# Patient Record
Sex: Female | Born: 1955 | Race: White | Hispanic: No | Marital: Single | Smoking: Current every day smoker
Health system: Southern US, Community
[De-identification: ages and names within clinical notes are randomized; demographics above are authoritative.]

## PROBLEM LIST (undated history)

## (undated) DIAGNOSIS — J449 Chronic obstructive pulmonary disease, unspecified: Secondary | ICD-10-CM

## (undated) DIAGNOSIS — J45909 Unspecified asthma, uncomplicated: Secondary | ICD-10-CM

## (undated) HISTORY — PX: ESOPHAGEAL DILATION: SHX303

## (undated) HISTORY — PX: BRONCHOSCOPY: SUR163

---

## 2007-11-22 ENCOUNTER — Emergency Department: Payer: Self-pay | Admitting: Internal Medicine

## 2018-07-22 ENCOUNTER — Other Ambulatory Visit: Payer: Self-pay | Admitting: Physician Assistant

## 2018-07-22 DIAGNOSIS — Z1231 Encounter for screening mammogram for malignant neoplasm of breast: Secondary | ICD-10-CM

## 2018-08-07 ENCOUNTER — Ambulatory Visit
Admission: RE | Admit: 2018-08-07 | Discharge: 2018-08-07 | Disposition: A | Discharge status: Home / Self Care | Payer: Medicaid Other | Source: Ambulatory Visit | Attending: Physician Assistant | Admitting: Physician Assistant

## 2018-08-07 DIAGNOSIS — Z1231 Encounter for screening mammogram for malignant neoplasm of breast: Secondary | ICD-10-CM | POA: Diagnosis not present

## 2018-08-12 ENCOUNTER — Other Ambulatory Visit: Payer: Self-pay | Admitting: *Deleted

## 2018-08-12 ENCOUNTER — Inpatient Hospital Stay
Admission: RE | Admit: 2018-08-12 | Discharge: 2018-08-12 | Disposition: A | Discharge status: Home / Self Care | Payer: Self-pay | Source: Ambulatory Visit | Attending: *Deleted | Admitting: *Deleted

## 2018-08-12 DIAGNOSIS — Z9289 Personal history of other medical treatment: Secondary | ICD-10-CM

## 2018-08-14 ENCOUNTER — Other Ambulatory Visit: Payer: Self-pay | Admitting: Physician Assistant

## 2018-08-14 DIAGNOSIS — N6320 Unspecified lump in the left breast, unspecified quadrant: Secondary | ICD-10-CM

## 2018-08-14 DIAGNOSIS — R928 Other abnormal and inconclusive findings on diagnostic imaging of breast: Secondary | ICD-10-CM

## 2018-08-21 ENCOUNTER — Ambulatory Visit
Admission: RE | Admit: 2018-08-21 | Discharge: 2018-08-21 | Disposition: A | Discharge status: Home / Self Care | Payer: Medicaid Other | Source: Ambulatory Visit | Attending: Physician Assistant | Admitting: Physician Assistant

## 2018-08-21 DIAGNOSIS — R928 Other abnormal and inconclusive findings on diagnostic imaging of breast: Secondary | ICD-10-CM

## 2018-08-21 DIAGNOSIS — N6320 Unspecified lump in the left breast, unspecified quadrant: Secondary | ICD-10-CM | POA: Diagnosis present

## 2018-08-21 DIAGNOSIS — N6321 Unspecified lump in the left breast, upper outer quadrant: Secondary | ICD-10-CM | POA: Insufficient documentation

## 2018-09-04 ENCOUNTER — Emergency Department
Admission: EM | Admit: 2018-09-04 | Discharge: 2018-09-04 | Disposition: A | Discharge status: Home / Self Care | Payer: Medicaid Other | Attending: Emergency Medicine | Admitting: Emergency Medicine

## 2018-09-04 ENCOUNTER — Emergency Department: Payer: Medicaid Other

## 2018-09-04 ENCOUNTER — Encounter: Payer: Self-pay | Admitting: Emergency Medicine

## 2018-09-04 ENCOUNTER — Other Ambulatory Visit: Payer: Self-pay

## 2018-09-04 DIAGNOSIS — M25512 Pain in left shoulder: Secondary | ICD-10-CM | POA: Insufficient documentation

## 2018-09-04 DIAGNOSIS — Z7982 Long term (current) use of aspirin: Secondary | ICD-10-CM | POA: Insufficient documentation

## 2018-09-04 DIAGNOSIS — J449 Chronic obstructive pulmonary disease, unspecified: Secondary | ICD-10-CM | POA: Insufficient documentation

## 2018-09-04 DIAGNOSIS — F17210 Nicotine dependence, cigarettes, uncomplicated: Secondary | ICD-10-CM | POA: Insufficient documentation

## 2018-09-04 HISTORY — DX: Unspecified asthma, uncomplicated: J45.909

## 2018-09-04 HISTORY — DX: Chronic obstructive pulmonary disease, unspecified: J44.9

## 2018-09-04 MED ORDER — MELOXICAM 15 MG PO TABS: 15.0000 mg | ORAL_TABLET | Freq: Every day | ORAL | 0 refills | Status: AC

## 2018-09-04 MED ORDER — SODIUM CHLORIDE 0.9 % IV SOLN: INTRAVENOUS | Status: DC

## 2018-09-04 NOTE — ED Triage Notes (Signed)
C/O left shoulder and left upper arm pain for 2-3 weeks.  Pain worse with movment.

## 2018-09-04 NOTE — Discharge Instructions (Addendum)
Follow-up with your primary care provider if any continued problems.  Begin taking meloxicam 1 daily with food.  Do not take this medication with BC powders.  Any worsening of your symptoms return to the emergency department.  You should plan on following up with your primary care provider in 2 weeks.

## 2018-09-04 NOTE — ED Provider Notes (Addendum)
[facility] Emergency Department Provider Note   ____________________________________________   First MD Initiated Contact with Patient 09/04/18 1100     (approximate)  I have reviewed the triage vital signs and the nursing notes.   HISTORY  Chief Complaint Shoulder Pain   HPI Jessica Richard is a 62 y.o. female presents to the ED with complaint of left upper arm pain for approximately 2 to 3 weeks.  Patient denies any recent injury to her arm.  She states that she fell sometime ago but does not remember hurting her arm.  She states she has not had any chest pain, shortness of breath, nausea or vomiting.  Patient denies any chest pain or diaphoresis.  Patient has taken [facility] powders 1/week for her pain.  She states that she does not like taking pain medication.  Rates her pain as 7 out of 10.   Past Medical History:  Diagnosis Date  . Asthma   . COPD (chronic obstructive pulmonary disease) (HCC)     There are no active problems to display for this patient.   Past Surgical History:  Procedure Laterality Date  . BRONCHOSCOPY    . ESOPHAGEAL DILATION      Prior to Admission medications   Medication Sig Start Date End Date Taking? Authorizing Provider  albuterol (PROVENTIL HFA;VENTOLIN HFA) 108 (90 Base) MCG/ACT inhaler Inhale 2 puffs into the lungs every 6 (six) hours as needed for wheezing or shortness of breath.   Yes [provider]  aspirin 81 MG chewable tablet Chew 81 mg by mouth daily.   Yes [provider]  budesonide-formoterol (SYMBICORT) 160-4.5 MCG/ACT inhaler Inhale 2 puffs into the lungs 2 (two) times daily.   Yes [provider]  tiotropium (SPIRIVA) 18 MCG inhalation capsule Place 18 mcg into inhaler and inhale daily.   Yes [provider]  meloxicam (MOBIC) 15 MG tablet Take 1 tablet (15 mg total) by mouth daily. 09/04/18 09/04/19  Tommi Rumps, PA-C    Allergies Penicillins and Sulfa  antibiotics  Family History  Problem Relation Age of Onset  . Breast cancer Sister        20's  . Breast cancer Maternal Aunt        60's  . Breast cancer Paternal Aunt        40's    Social History Social History   Tobacco Use  . Smoking status: Current Every Day Smoker    Types: Cigarettes  . Smokeless tobacco: Never Used  Substance Use Topics  . Alcohol use: Not on file  . Drug use: Not on file    Review of Systems Constitutional: No fever/chills Eyes: No visual changes. ENT: No sore throat. Cardiovascular: Denies chest pain. Respiratory: Denies shortness of breath. Gastrointestinal: No abdominal pain.  No nausea, no vomiting. Musculoskeletal: Positive left shoulder pain. Skin: Negative for rash. Neurological: Negative for headaches, focal weakness or numbness. ___________________________________________   PHYSICAL EXAM:  VITAL SIGNS: ED Triage Vitals  Enc Vitals Group     BP 09/04/18 1051 123/65     Pulse Rate 09/04/18 1051 88     Resp 09/04/18 1051 15     Temp 09/04/18 1051 98.2 F (36.8 C)     Temp Source 09/04/18 1051 Oral     SpO2 09/04/18 1051 96 %     Weight 09/04/18 1041 160 lb (72.6 kg)     Height 09/04/18 1051 5\' 1"  (1.549 m)     Head Circumference --  Peak Flow --      Pain Score 09/04/18 1041 7     Pain Loc --      Pain Edu? --      Excl. in GC? --    Constitutional: Alert and oriented. Well appearing and in no acute distress. Eyes: Conjunctivae are normal. PERRL. EOMI. Head: Atraumatic. Nose: No congestion/rhinnorhea. Neck: No stridor.  Nontender cervical spine to palpation posteriorly. Cardiovascular: Normal rate, regular rhythm. Grossly normal heart sounds.  Good peripheral circulation. Respiratory: Normal respiratory effort.  No retractions. Lungs CTAB. Gastrointestinal: Soft and nontender. No distention.  Musculoskeletal: Examination of left shoulder there is some discomfort with abduction but patient is able to move in all  other planes without any difficulty.  There is some minimal crepitus noted with abduction.  No gross deformity or joint swelling is appreciated.  Skin is intact.  No ecchymosis or abrasions were seen.  Nontender left elbow to palpation and range of motion is without restriction. Neurologic:  Normal speech and language. No gross focal neurologic deficits are appreciated.  Skin:  Skin is warm, dry and intact. No rash noted. Psychiatric: Mood and affect are normal. Speech and behavior are normal.  ____________________________________________   LABS (all labs ordered are listed, but only abnormal results are displayed)  Labs Reviewed - No data to display  RADIOLOGY  ED MD interpretation:   No acute bony injury is noted on left shoulder.  Official radiology report(s): Dg Shoulder Left  Result Date: 09/04/2018 CLINICAL DATA:  Left shoulder pain for 3 weeks.  No known injury. EXAM: LEFT SHOULDER - 2+ VIEW COMPARISON:  None. FINDINGS: There is no evidence of fracture or dislocation. There is no evidence of arthropathy or other focal bone abnormality. Soft tissues are unremarkable. IMPRESSION: Negative exam. Electronically Signed   By: Drusilla Kanner M.D.   On: 09/04/2018 12:32    ____________________________________________   PROCEDURES  Procedure(s) performed: None  Procedures  Critical Care performed: No  ____________________________________________   INITIAL IMPRESSION / ASSESSMENT AND PLAN / ED COURSE  As part of my medical decision making, I reviewed the following data within the electronic MEDICAL RECORD NUMBER Notes from prior ED visits and [place] Controlled Substance Database  Patient presents to the ED with complaint of left upper arm pain for 2 to 3 weeks.  She is been taking BC powders 1/week without any relief of her pain.  Patient denies any recent injury to her arm.  Pain is increased with abduction only.  X-rays were reassuring that there was no acute injury.  Patient was  given a prescription for meloxicam 50 mg 1 daily with food.  She is to follow-up with her PCP if any continued problems.  As part of protocol EKG was ordered however patient refused EKG stating that she only wanted her shoulder x-rayed. ____________________________________________   FINAL CLINICAL IMPRESSION(S) / ED DIAGNOSES  Final diagnoses:  Acute pain of left shoulder     ED Discharge Orders         Ordered    meloxicam (MOBIC) 15 MG tablet  Daily     09/04/18 1239           Note:  This document was prepared using Dragon voice recognition software and may include unintentional dictation errors.    Tommi Rumps, PA-C 09/04/18 1450    Tommi Rumps, PA-C 09/04/18 1451    Emily Filbert, MD 09/04/18 [phone]

## 2018-09-04 NOTE — ED Notes (Signed)
See triage note  Presents with pain to left upper arm for about 2 weeks   States she has had a fall but not sure if that is what hurt her arm  No deformity noted  Good pulses  Increased pain with movement

## 2019-06-26 IMAGING — US US BREAST*L* LIMITED INC AXILLA
1 series · 9 of 9 positions shown · non-contrast
Comparison: Previous exam(s).

CLINICAL DATA: Patient presents for additional views of the left
breast as follow-up to recent screening exam to evaluate a possible
mass.

EXAM:
DIGITAL DIAGNOSTIC left MAMMOGRAM WITH TOMO
ULTRASOUND left BREAST

[Series 1: us breast*left* limited inc axilla · 0.04mm/px · 9 of 9 slices shown]
[im 1/9]
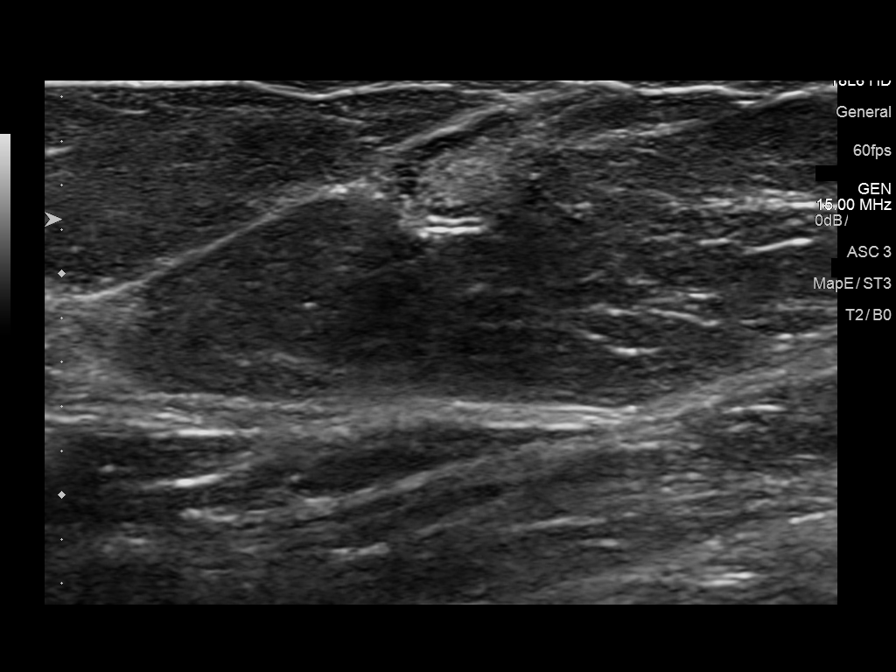
[im 2/9]
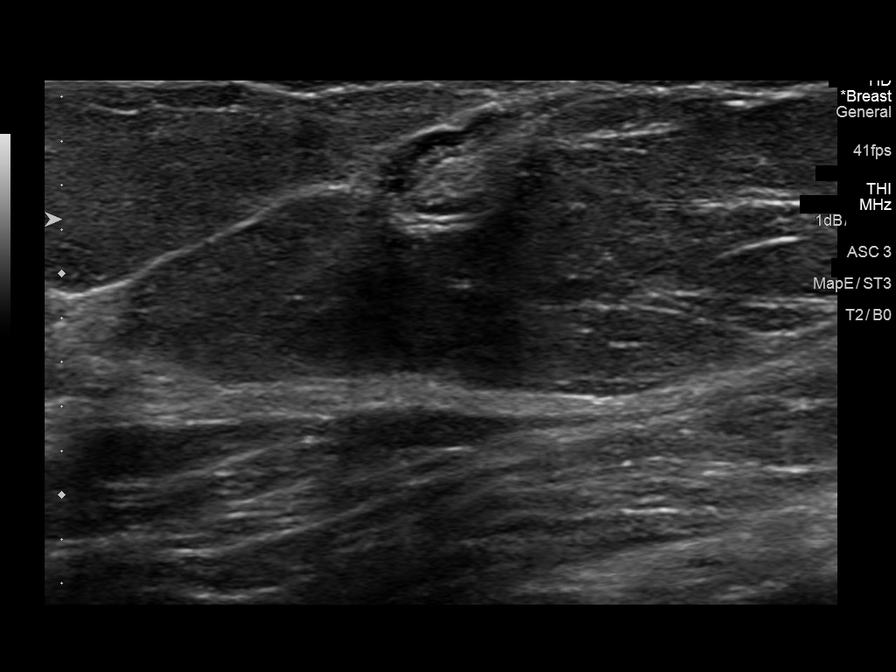
[im 3/9]
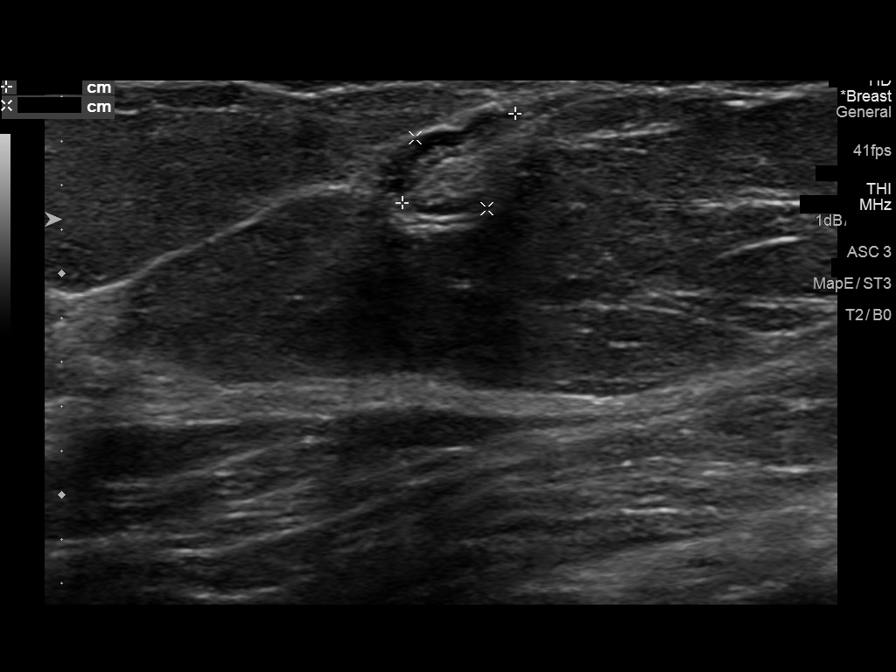
[im 4/9]
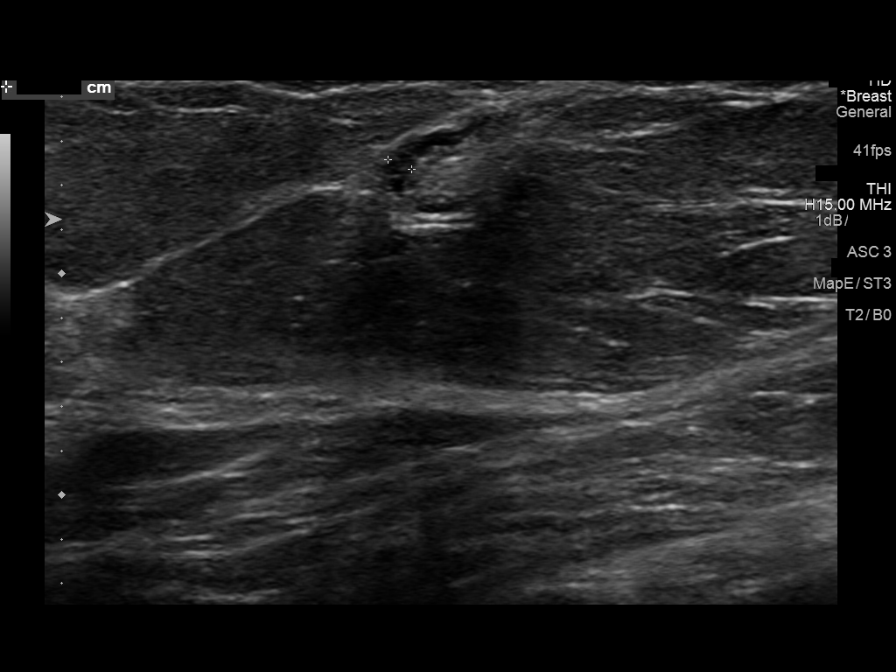
[im 5/9]
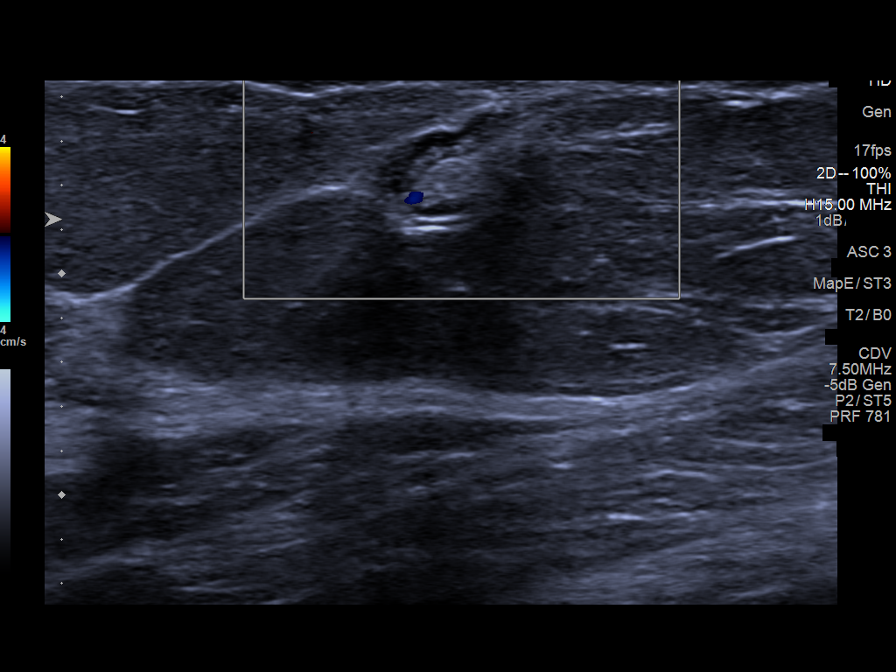
[im 6/9]
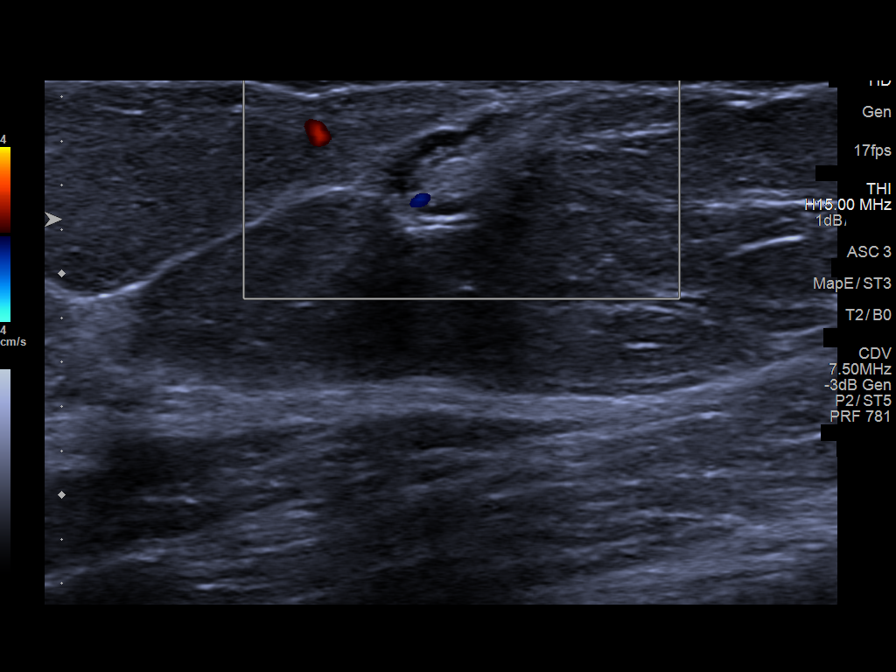
[im 7/9]
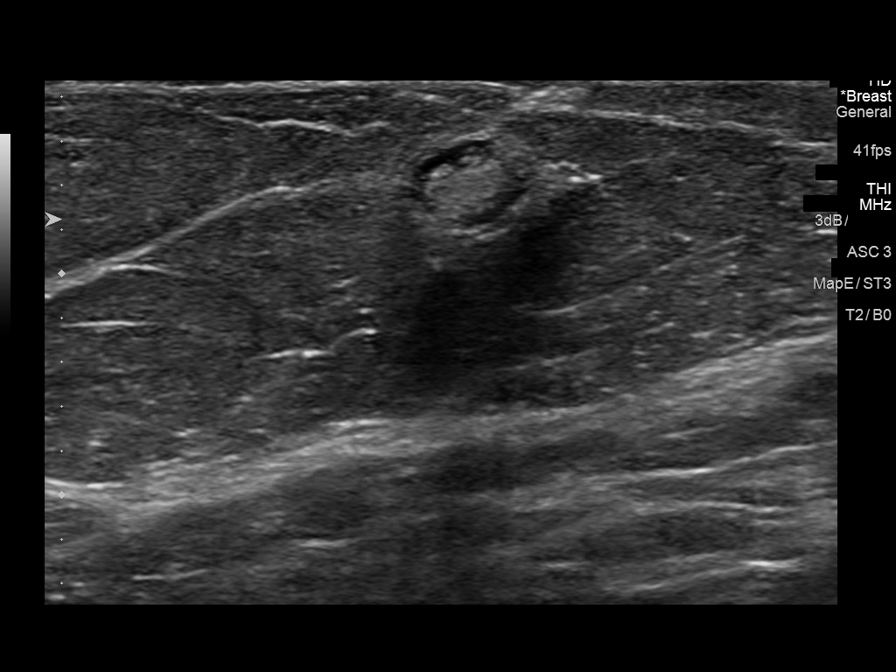
[im 8/9]
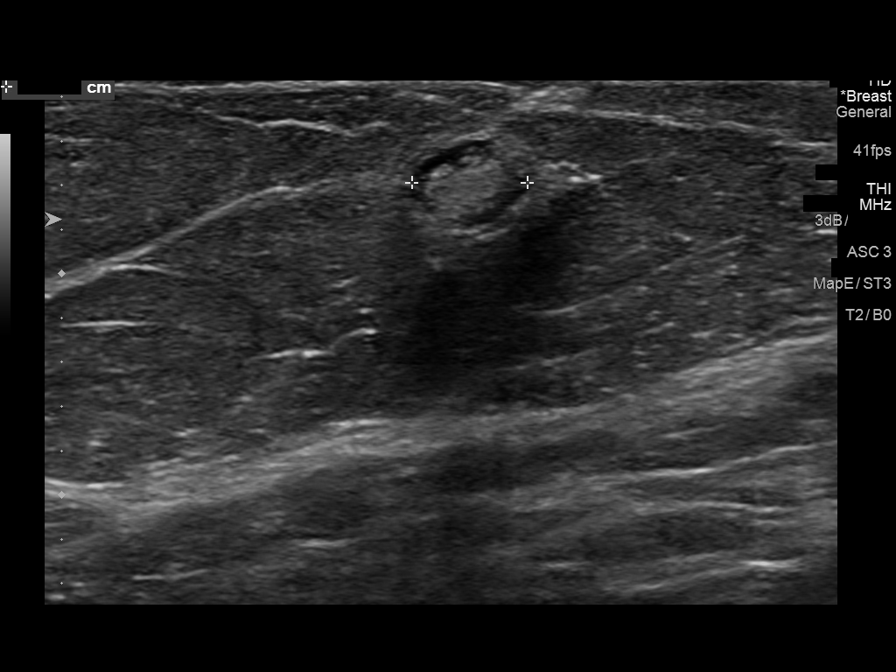
[im 9/9]
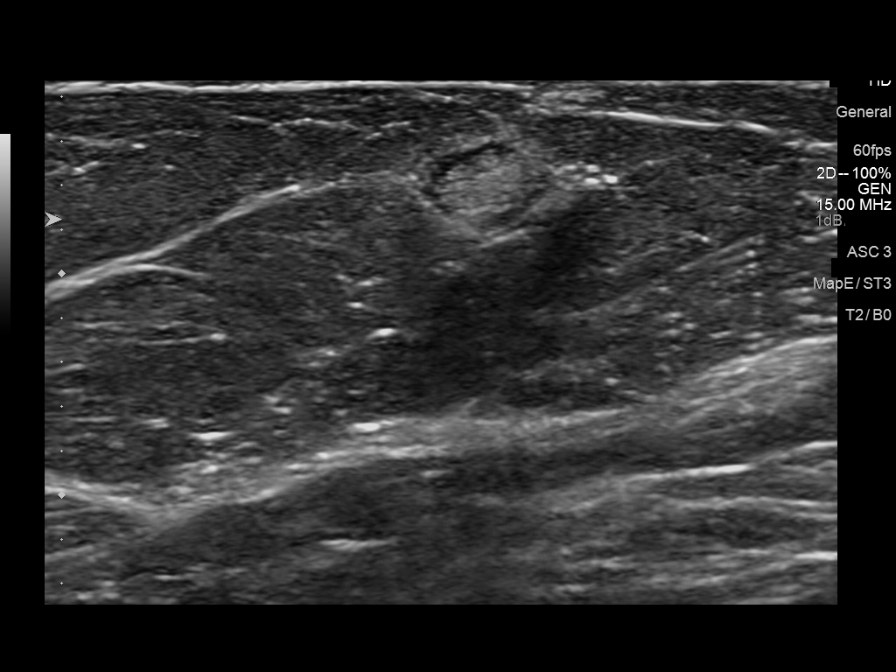

[9 of 9 positions shown; findings below may reference images not displayed]

ACR Breast Density Category b: There are scattered areas of
fibroglandular density.
FINDINGS: Additional spot compression CC and true lateral images demonstrate
an oval circumscribed mass over the upper outer left breast with
eccentric fat likely an intramammary lymph node as this is unchanged
on the MLO view from 5349.

Targeted ultrasound is performed, showing a normal intramammary
lymph node over the 2 o'clock position of the left breast 7 cm from
the nipple measuring 5 x 5 x 7 mm and accounting for the
mammographic abnormality.
IMPRESSION: Normal 7 mm intramammary lymph node over the upper-outer left breast
accounting for the mammographic finding.

RECOMMENDATION:
Recommend continued annual bilateral screening mammographic
follow-up.

I have discussed the findings and recommendations with the patient.
Results were also provided in writing at the conclusion of the
visit. If applicable, a reminder letter will be sent to the patient
regarding the next appointment.

BI-RADS CATEGORY  2: Benign.

## 2019-06-27 IMAGING — CR DG SHOULDER 2+V*L*
1 series · 3 of 3 positions shown · non-contrast
Comparison: None.

CLINICAL DATA: Left shoulder pain for 3 weeks.  No known injury.

EXAM:
LEFT SHOULDER - 2+ VIEW

[Series 1: dg shoulder left · 0.14mm/px · 3 of 3 slices shown]
[im 1/3]
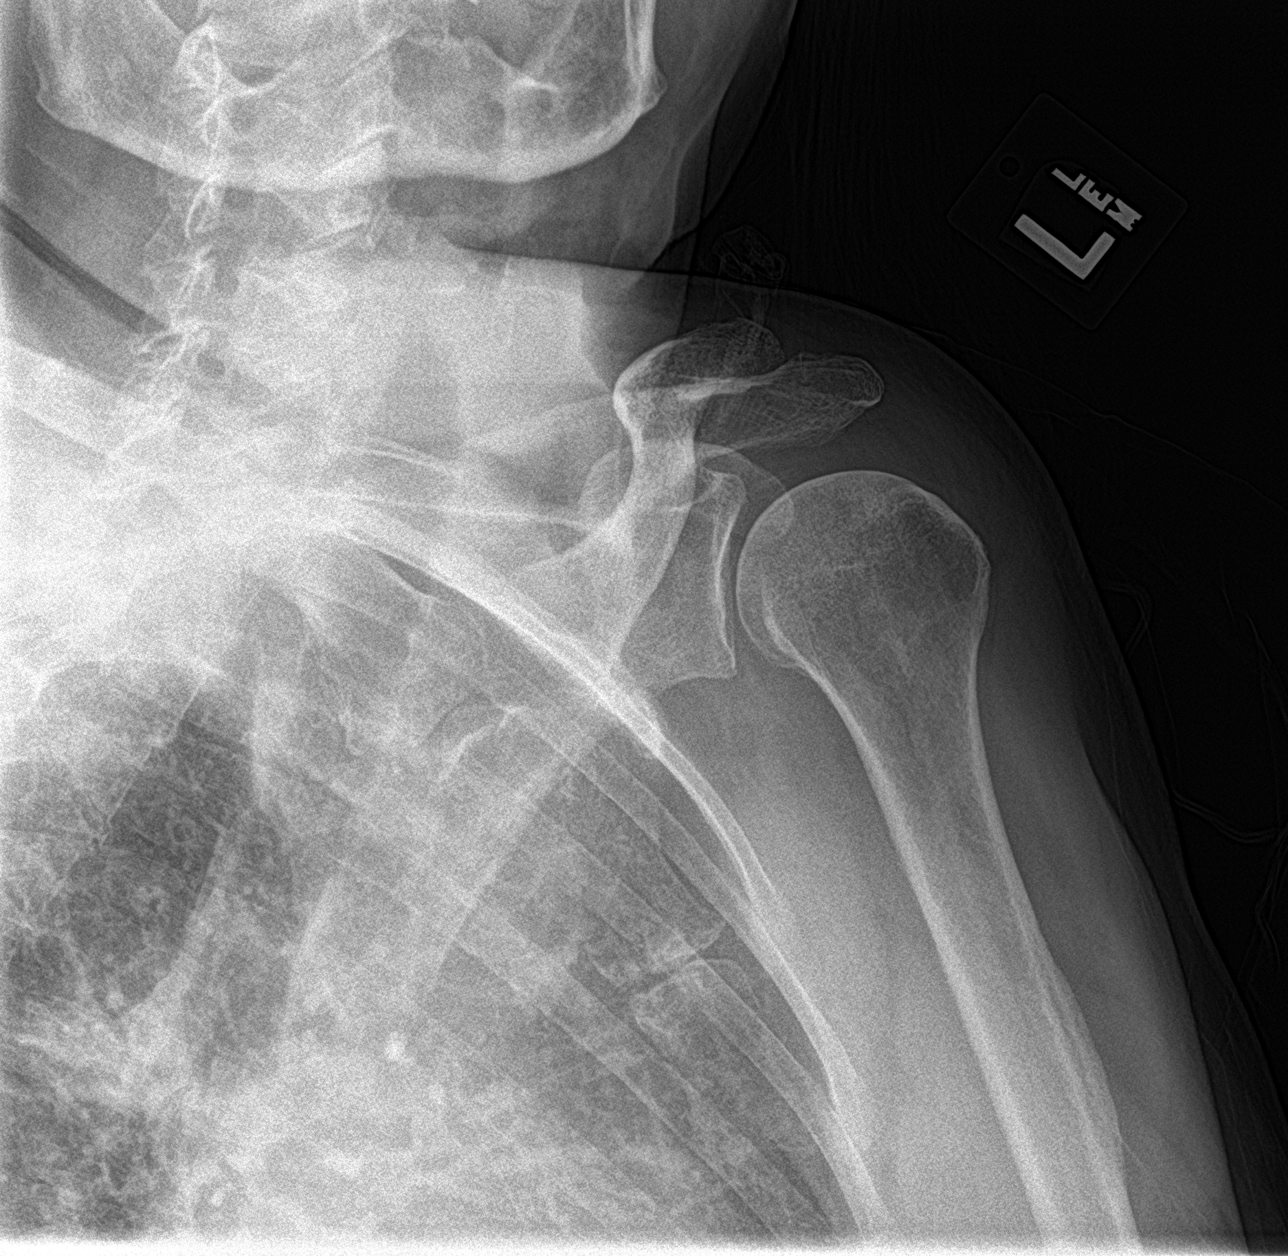
[im 2/3]
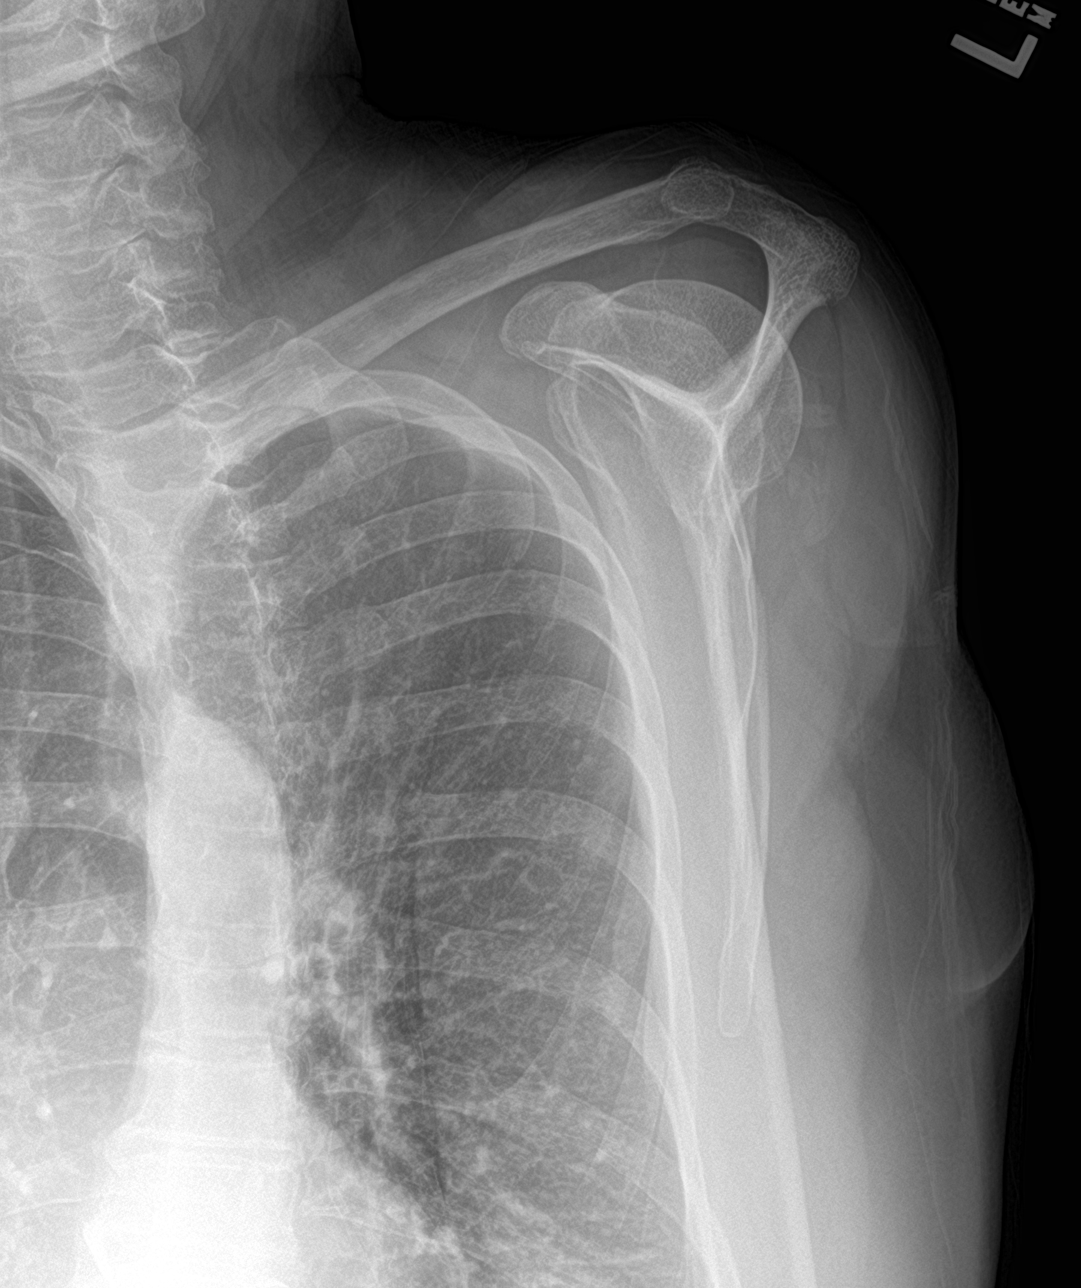
[im 3/3]
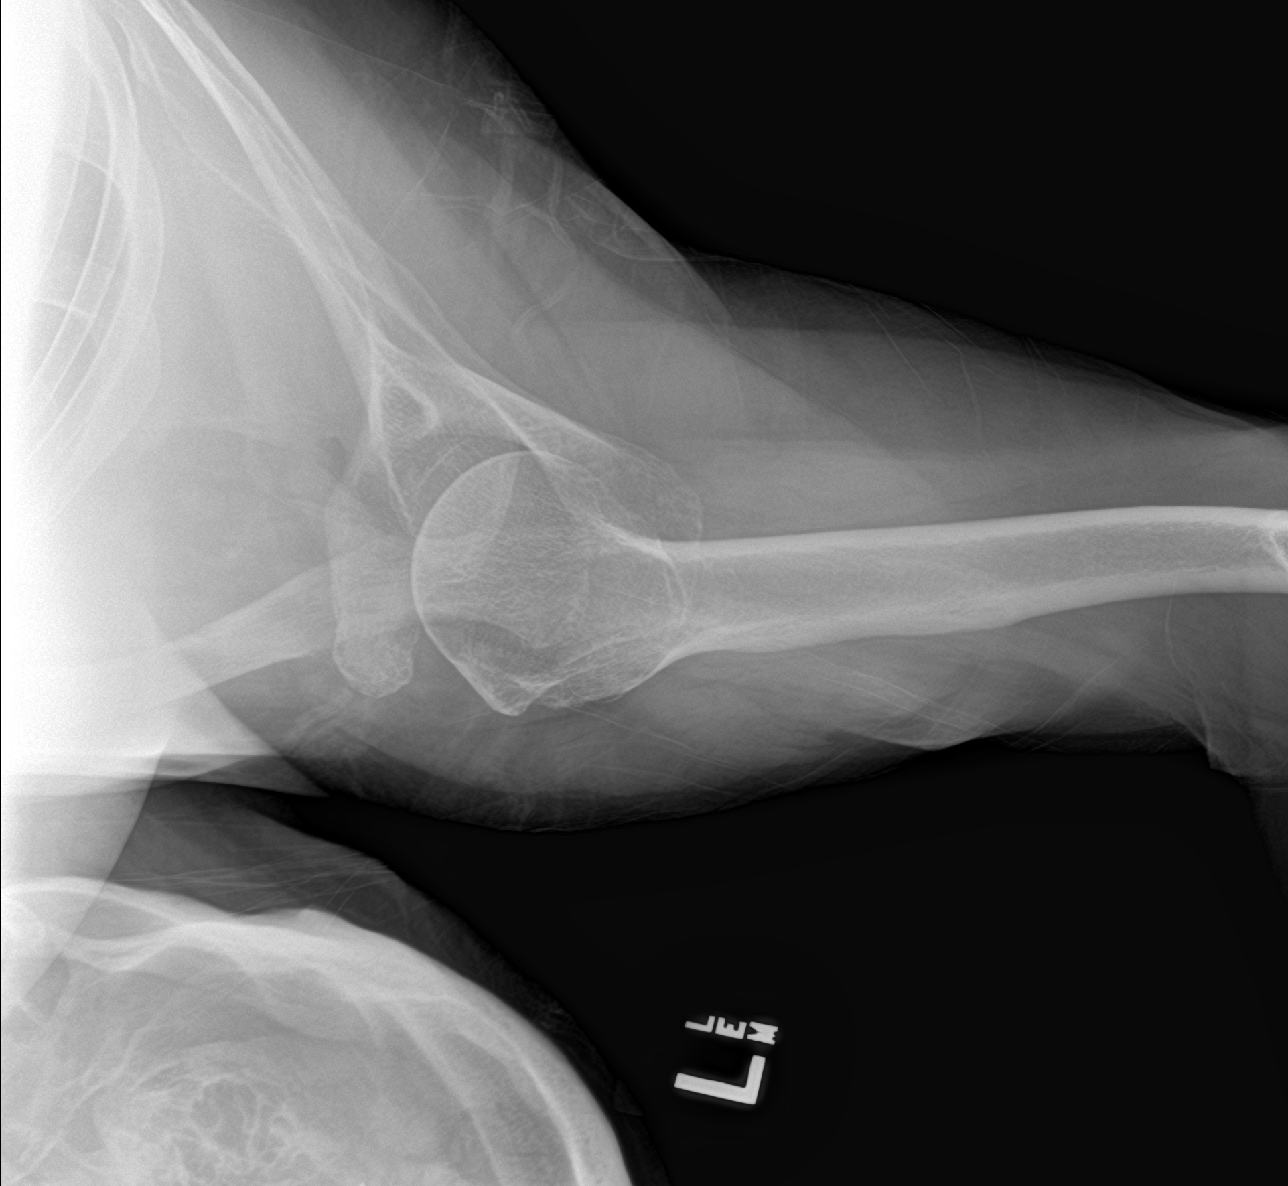

[3 of 3 positions shown; findings below may reference images not displayed]

FINDINGS: There is no evidence of fracture or dislocation. There is no
evidence of arthropathy or other focal bone abnormality. Soft
tissues are unremarkable.
IMPRESSION: Negative exam.

## 2020-11-10 ENCOUNTER — Other Ambulatory Visit (HOSPITAL_COMMUNITY): Payer: Self-pay | Admitting: Obstetrics

## 2020-11-10 DIAGNOSIS — N6452 Nipple discharge: Secondary | ICD-10-CM

## 2021-03-17 ENCOUNTER — Encounter: Payer: Self-pay | Admitting: Obstetrics and Gynecology

## 2021-03-17 ENCOUNTER — Ambulatory Visit (INDEPENDENT_AMBULATORY_CARE_PROVIDER_SITE_OTHER): Payer: Medicare Other | Admitting: Obstetrics and Gynecology

## 2021-03-17 ENCOUNTER — Other Ambulatory Visit: Payer: Self-pay

## 2021-03-17 VITALS — BP 126/67 | HR 90 | Ht 61.0 in | Wt 142.7 lb

## 2021-03-17 DIAGNOSIS — J449 Chronic obstructive pulmonary disease, unspecified: Secondary | ICD-10-CM | POA: Diagnosis not present

## 2021-03-17 DIAGNOSIS — Z720 Tobacco use: Secondary | ICD-10-CM

## 2021-03-17 DIAGNOSIS — Z4689 Encounter for fitting and adjustment of other specified devices: Secondary | ICD-10-CM | POA: Diagnosis not present

## 2021-03-17 DIAGNOSIS — N819 Female genital prolapse, unspecified: Secondary | ICD-10-CM | POA: Diagnosis not present

## 2021-03-17 DIAGNOSIS — Z7689 Persons encountering health services in other specified circumstances: Secondary | ICD-10-CM

## 2021-03-17 NOTE — Progress Notes (Signed)
HPI:      [name] is a 65 y.o. [phone] who LMP was No LMP recorded. Patient is postmenopausal.  Subjective:   She presents today essentially to establish care.  She has previously been followed at [facility] for genital prolapse.  Because she had significant shortness of breath from COPD she was determined to be a poor candidate for surgery.  A pessary was placed.  The patient has been using this pessary for greater than 1 year and it has been a success for her.  She removes it and cleans it herself.  She recently had some difficulty in removing it and became concerned.  She would like to establish GYN care in this area so that if she has any further problems with removal or with her pessary she can have local doctors to know her and can manage this issue. She reports her COPD is somewhat improved since that time but because the pessary has been working she does not desire a reconsideration for surgery.    Hx: The following portions of the patient's history were reviewed and updated as appropriate:             She  has a past medical history of Asthma and COPD (chronic obstructive pulmonary disease) (HCC). She does not have a problem list on file. She  has a past surgical history that includes Bronchoscopy and Esophageal dilation. Her family history includes Breast cancer in her maternal aunt, paternal aunt, and sister. She  reports that she has been smoking cigarettes. She has never used smokeless tobacco. No history on file for alcohol use and drug use. She has a current medication list which includes the following prescription(s): albuterol, aspirin, tiotropium, and budesonide-formoterol. She is allergic to ciprofloxacin, penicillins, and sulfa antibiotics.       Review of Systems:  Review of Systems  Constitutional: Denied constitutional symptoms, night sweats, recent illness, fatigue, fever, insomnia and weight loss.  Eyes: Denied eye symptoms, eye pain, photophobia, vision change and  visual disturbance.  Ears/Nose/Throat/Neck: Denied ear, nose, throat or neck symptoms, hearing loss, nasal discharge, sinus congestion and sore throat.  Cardiovascular: Denied cardiovascular symptoms, arrhythmia, chest pain/pressure, edema, exercise intolerance, orthopnea and palpitations.  Respiratory: Denied pulmonary symptoms, asthma, pleuritic pain, productive sputum, cough, dyspnea and wheezing.  Gastrointestinal: Denied, gastro-esophageal reflux, melena, nausea and vomiting.  Genitourinary: Denied genitourinary symptoms including symptomatic vaginal discharge, pelvic relaxation issues, and urinary complaints.  Musculoskeletal: Denied musculoskeletal symptoms, stiffness, swelling, muscle weakness and myalgia.  Dermatologic: Denied dermatology symptoms, rash and scar.  Neurologic: Denied neurology symptoms, dizziness, headache, neck pain and syncope.  Psychiatric: Denied psychiatric symptoms, anxiety and depression.  Endocrine: Denied endocrine symptoms including hot flashes and night sweats.   Meds:   Current Outpatient Medications on File Prior to Visit  Medication Sig Dispense Refill  . albuterol (PROVENTIL HFA;VENTOLIN HFA) 108 (90 Base) MCG/ACT inhaler Inhale 2 puffs into the lungs every 6 (six) hours as needed for wheezing or shortness of breath.    Marland Kitchen aspirin 81 MG chewable tablet Chew 81 mg by mouth daily.    Marland Kitchen tiotropium (SPIRIVA) 18 MCG inhalation capsule Place 18 mcg into inhaler and inhale daily.    . budesonide-formoterol (SYMBICORT) 160-4.5 MCG/ACT inhaler Inhale 2 puffs into the lungs 2 (two) times daily. (Patient not taking: Reported on 03/17/2021)     No current facility-administered medications on file prior to visit.          Objective:     Vitals:  03/17/21 1254  BP: 126/67  Pulse: 90   Filed Weights   03/17/21 1254  Weight: 142 lb 11.2 oz (64.7 kg)                Assessment:    [id number] There are no problems to display for this patient.    1.  Female genital prolapse, unspecified type   2. History of removal of pessary   3. Chronic obstructive pulmonary disease, unspecified COPD type (HCC)   4. Tobacco use     Patient with pessary in place today.  Recently replaced 1 week ago.   Plan:            1.  Discussed pessary use in detail.  Use of Premarin cream one third of an applicator 1-2 times per week discussed.  Pessary care and maintenance discussed. I informed her that we would be here should she have any difficulties with the pessary and need follow-up.  2.  Discussed possibility of surgery in the future and patient is not interested at this time.  3.  Advised patient to discontinue tobacco use as it only exacerbates her COPD.  All of her questions were answered and her concerns were addressed. Orders No orders of the defined types were placed in this encounter.   No orders of the defined types were placed in this encounter.     F/U  No follow-ups on file. I spent 31 minutes involved in the care of this patient preparing to see the patient by obtaining and reviewing her medical history (including labs, imaging tests and prior procedures), documenting clinical information in the electronic health record (EHR), counseling and coordinating care plans, writing and sending prescriptions, ordering tests or procedures and directly communicating with the patient by discussing pertinent items from her history and physical exam as well as detailing my assessment and plan as noted above so that she has an informed understanding.  All of her questions were answered.  Elonda Husky, M.D. 03/17/2021 1:32 PM

## 2024-02-26 ENCOUNTER — Other Ambulatory Visit: Payer: Self-pay | Admitting: Student

## 2024-02-26 ENCOUNTER — Ambulatory Visit
Admission: RE | Admit: 2024-02-26 | Discharge: 2024-02-26 | Disposition: A | Discharge status: Home / Self Care | Attending: *Deleted | Admitting: *Deleted

## 2024-02-26 ENCOUNTER — Ambulatory Visit
Admission: RE | Admit: 2024-02-26 | Discharge: 2024-02-26 | Disposition: A | Discharge status: Home / Self Care | Source: Ambulatory Visit | Attending: Physician Assistant | Admitting: Physician Assistant

## 2024-02-26 DIAGNOSIS — M25512 Pain in left shoulder: Secondary | ICD-10-CM

## 2024-08-19 ENCOUNTER — Other Ambulatory Visit: Payer: Self-pay | Admitting: Physician Assistant

## 2024-08-19 DIAGNOSIS — Z1231 Encounter for screening mammogram for malignant neoplasm of breast: Secondary | ICD-10-CM
